# Patient Record
Sex: Male | Born: 2000 | Race: White | Hispanic: No | Marital: Single | Smoking: Never smoker
Health system: Southern US, Community
[De-identification: ages and names within clinical notes are randomized; demographics above are authoritative.]

## PROBLEM LIST (undated history)

## (undated) HISTORY — PX: ELBOW SURGERY: SHX618

---
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
## 2022-01-05 DIAGNOSIS — F4322 Adjustment disorder with anxiety: Secondary | ICD-10-CM | POA: Diagnosis not present

## 2022-02-05 DIAGNOSIS — F902 Attention-deficit hyperactivity disorder, combined type: Secondary | ICD-10-CM | POA: Diagnosis not present

## 2022-02-28 DIAGNOSIS — K648 Other hemorrhoids: Secondary | ICD-10-CM | POA: Diagnosis not present

## 2022-02-28 DIAGNOSIS — K120 Recurrent oral aphthae: Secondary | ICD-10-CM | POA: Diagnosis not present

## 2022-02-28 DIAGNOSIS — Z113 Encounter for screening for infections with a predominantly sexual mode of transmission: Secondary | ICD-10-CM | POA: Diagnosis not present

## 2022-03-18 DIAGNOSIS — F909 Attention-deficit hyperactivity disorder, unspecified type: Secondary | ICD-10-CM | POA: Diagnosis not present

## 2022-03-18 DIAGNOSIS — Z79899 Other long term (current) drug therapy: Secondary | ICD-10-CM | POA: Diagnosis not present

## 2022-03-18 DIAGNOSIS — Z0000 Encounter for general adult medical examination without abnormal findings: Secondary | ICD-10-CM | POA: Diagnosis not present

## 2022-03-18 DIAGNOSIS — Z113 Encounter for screening for infections with a predominantly sexual mode of transmission: Secondary | ICD-10-CM | POA: Diagnosis not present

## 2022-04-15 DIAGNOSIS — Z113 Encounter for screening for infections with a predominantly sexual mode of transmission: Secondary | ICD-10-CM | POA: Diagnosis not present

## 2022-04-15 DIAGNOSIS — Z79899 Other long term (current) drug therapy: Secondary | ICD-10-CM | POA: Diagnosis not present

## 2022-04-15 DIAGNOSIS — F909 Attention-deficit hyperactivity disorder, unspecified type: Secondary | ICD-10-CM | POA: Diagnosis not present

## 2022-04-15 DIAGNOSIS — Z68.41 Body mass index (BMI) pediatric, 5th percentile to less than 85th percentile for age: Secondary | ICD-10-CM | POA: Diagnosis not present

## 2022-04-20 DIAGNOSIS — F902 Attention-deficit hyperactivity disorder, combined type: Secondary | ICD-10-CM | POA: Diagnosis not present

## 2022-04-23 DIAGNOSIS — Z13228 Encounter for screening for other metabolic disorders: Secondary | ICD-10-CM | POA: Diagnosis not present

## 2022-04-23 DIAGNOSIS — Z113 Encounter for screening for infections with a predominantly sexual mode of transmission: Secondary | ICD-10-CM | POA: Diagnosis not present

## 2022-04-27 DIAGNOSIS — F902 Attention-deficit hyperactivity disorder, combined type: Secondary | ICD-10-CM | POA: Diagnosis not present

## 2022-05-04 DIAGNOSIS — F902 Attention-deficit hyperactivity disorder, combined type: Secondary | ICD-10-CM | POA: Diagnosis not present

## 2022-05-18 DIAGNOSIS — F902 Attention-deficit hyperactivity disorder, combined type: Secondary | ICD-10-CM | POA: Diagnosis not present

## 2022-05-25 DIAGNOSIS — F902 Attention-deficit hyperactivity disorder, combined type: Secondary | ICD-10-CM | POA: Diagnosis not present

## 2022-05-27 DIAGNOSIS — Z113 Encounter for screening for infections with a predominantly sexual mode of transmission: Secondary | ICD-10-CM | POA: Diagnosis not present

## 2022-05-27 DIAGNOSIS — Z68.41 Body mass index (BMI) pediatric, 5th percentile to less than 85th percentile for age: Secondary | ICD-10-CM | POA: Diagnosis not present

## 2022-05-27 DIAGNOSIS — Z79899 Other long term (current) drug therapy: Secondary | ICD-10-CM | POA: Diagnosis not present

## 2022-05-27 DIAGNOSIS — F909 Attention-deficit hyperactivity disorder, unspecified type: Secondary | ICD-10-CM | POA: Diagnosis not present

## 2022-06-01 DIAGNOSIS — F902 Attention-deficit hyperactivity disorder, combined type: Secondary | ICD-10-CM | POA: Diagnosis not present

## 2022-06-09 DIAGNOSIS — F902 Attention-deficit hyperactivity disorder, combined type: Secondary | ICD-10-CM | POA: Diagnosis not present

## 2022-06-15 DIAGNOSIS — F902 Attention-deficit hyperactivity disorder, combined type: Secondary | ICD-10-CM | POA: Diagnosis not present

## 2022-06-22 DIAGNOSIS — F902 Attention-deficit hyperactivity disorder, combined type: Secondary | ICD-10-CM | POA: Diagnosis not present

## 2022-06-23 DIAGNOSIS — F909 Attention-deficit hyperactivity disorder, unspecified type: Secondary | ICD-10-CM | POA: Diagnosis not present

## 2022-06-23 DIAGNOSIS — Z113 Encounter for screening for infections with a predominantly sexual mode of transmission: Secondary | ICD-10-CM | POA: Diagnosis not present

## 2022-06-23 DIAGNOSIS — Z68.41 Body mass index (BMI) pediatric, 5th percentile to less than 85th percentile for age: Secondary | ICD-10-CM | POA: Diagnosis not present

## 2022-06-23 DIAGNOSIS — Z79899 Other long term (current) drug therapy: Secondary | ICD-10-CM | POA: Diagnosis not present

## 2022-06-29 DIAGNOSIS — F902 Attention-deficit hyperactivity disorder, combined type: Secondary | ICD-10-CM | POA: Diagnosis not present

## 2022-07-01 DIAGNOSIS — B360 Pityriasis versicolor: Secondary | ICD-10-CM | POA: Diagnosis not present

## 2022-07-01 DIAGNOSIS — L308 Other specified dermatitis: Secondary | ICD-10-CM | POA: Diagnosis not present

## 2022-07-01 DIAGNOSIS — Z1283 Encounter for screening for malignant neoplasm of skin: Secondary | ICD-10-CM | POA: Diagnosis not present

## 2022-07-01 DIAGNOSIS — D225 Melanocytic nevi of trunk: Secondary | ICD-10-CM | POA: Diagnosis not present

## 2022-07-06 DIAGNOSIS — F902 Attention-deficit hyperactivity disorder, combined type: Secondary | ICD-10-CM | POA: Diagnosis not present

## 2022-08-14 DIAGNOSIS — F902 Attention-deficit hyperactivity disorder, combined type: Secondary | ICD-10-CM | POA: Diagnosis not present

## 2022-08-20 DIAGNOSIS — F902 Attention-deficit hyperactivity disorder, combined type: Secondary | ICD-10-CM | POA: Diagnosis not present

## 2022-08-24 DIAGNOSIS — F902 Attention-deficit hyperactivity disorder, combined type: Secondary | ICD-10-CM | POA: Diagnosis not present

## 2022-08-25 DIAGNOSIS — Z79899 Other long term (current) drug therapy: Secondary | ICD-10-CM | POA: Diagnosis not present

## 2022-08-25 DIAGNOSIS — Z113 Encounter for screening for infections with a predominantly sexual mode of transmission: Secondary | ICD-10-CM | POA: Diagnosis not present

## 2022-08-25 DIAGNOSIS — F909 Attention-deficit hyperactivity disorder, unspecified type: Secondary | ICD-10-CM | POA: Diagnosis not present

## 2022-08-31 DIAGNOSIS — F902 Attention-deficit hyperactivity disorder, combined type: Secondary | ICD-10-CM | POA: Diagnosis not present

## 2022-09-11 DIAGNOSIS — F902 Attention-deficit hyperactivity disorder, combined type: Secondary | ICD-10-CM | POA: Diagnosis not present

## 2022-09-15 DIAGNOSIS — F902 Attention-deficit hyperactivity disorder, combined type: Secondary | ICD-10-CM | POA: Diagnosis not present

## 2022-09-24 DIAGNOSIS — F902 Attention-deficit hyperactivity disorder, combined type: Secondary | ICD-10-CM | POA: Diagnosis not present

## 2022-09-29 DIAGNOSIS — F902 Attention-deficit hyperactivity disorder, combined type: Secondary | ICD-10-CM | POA: Diagnosis not present

## 2022-10-06 DIAGNOSIS — F902 Attention-deficit hyperactivity disorder, combined type: Secondary | ICD-10-CM | POA: Diagnosis not present

## 2022-10-19 DIAGNOSIS — Z113 Encounter for screening for infections with a predominantly sexual mode of transmission: Secondary | ICD-10-CM | POA: Diagnosis not present

## 2022-10-21 DIAGNOSIS — Z113 Encounter for screening for infections with a predominantly sexual mode of transmission: Secondary | ICD-10-CM | POA: Diagnosis not present

## 2022-10-22 DIAGNOSIS — F902 Attention-deficit hyperactivity disorder, combined type: Secondary | ICD-10-CM | POA: Diagnosis not present

## 2022-10-27 DIAGNOSIS — F902 Attention-deficit hyperactivity disorder, combined type: Secondary | ICD-10-CM | POA: Diagnosis not present

## 2022-10-28 DIAGNOSIS — Z79899 Other long term (current) drug therapy: Secondary | ICD-10-CM | POA: Diagnosis not present

## 2022-10-28 DIAGNOSIS — Z1329 Encounter for screening for other suspected endocrine disorder: Secondary | ICD-10-CM | POA: Diagnosis not present

## 2022-10-28 DIAGNOSIS — F909 Attention-deficit hyperactivity disorder, unspecified type: Secondary | ICD-10-CM | POA: Diagnosis not present

## 2022-10-28 DIAGNOSIS — Z113 Encounter for screening for infections with a predominantly sexual mode of transmission: Secondary | ICD-10-CM | POA: Diagnosis not present

## 2022-10-28 DIAGNOSIS — Z136 Encounter for screening for cardiovascular disorders: Secondary | ICD-10-CM | POA: Diagnosis not present

## 2022-11-05 DIAGNOSIS — F902 Attention-deficit hyperactivity disorder, combined type: Secondary | ICD-10-CM | POA: Diagnosis not present

## 2022-12-23 DIAGNOSIS — F4323 Adjustment disorder with mixed anxiety and depressed mood: Secondary | ICD-10-CM | POA: Diagnosis not present

## 2022-12-30 DIAGNOSIS — F4323 Adjustment disorder with mixed anxiety and depressed mood: Secondary | ICD-10-CM | POA: Diagnosis not present

## 2023-01-13 DIAGNOSIS — F4323 Adjustment disorder with mixed anxiety and depressed mood: Secondary | ICD-10-CM | POA: Diagnosis not present

## 2023-02-01 DIAGNOSIS — F4323 Adjustment disorder with mixed anxiety and depressed mood: Secondary | ICD-10-CM | POA: Diagnosis not present

## 2023-03-04 DIAGNOSIS — F900 Attention-deficit hyperactivity disorder, predominantly inattentive type: Secondary | ICD-10-CM | POA: Diagnosis not present

## 2023-03-04 DIAGNOSIS — F419 Anxiety disorder, unspecified: Secondary | ICD-10-CM | POA: Diagnosis not present

## 2023-03-04 DIAGNOSIS — R1032 Left lower quadrant pain: Secondary | ICD-10-CM | POA: Diagnosis not present

## 2023-03-05 DIAGNOSIS — M25522 Pain in left elbow: Secondary | ICD-10-CM | POA: Diagnosis not present

## 2023-04-08 DIAGNOSIS — R5383 Other fatigue: Secondary | ICD-10-CM | POA: Diagnosis not present

## 2023-04-08 DIAGNOSIS — Z0000 Encounter for general adult medical examination without abnormal findings: Secondary | ICD-10-CM | POA: Diagnosis not present

## 2023-04-08 DIAGNOSIS — F900 Attention-deficit hyperactivity disorder, predominantly inattentive type: Secondary | ICD-10-CM | POA: Diagnosis not present

## 2023-04-08 DIAGNOSIS — F419 Anxiety disorder, unspecified: Secondary | ICD-10-CM | POA: Diagnosis not present

## 2023-05-10 DIAGNOSIS — F900 Attention-deficit hyperactivity disorder, predominantly inattentive type: Secondary | ICD-10-CM | POA: Diagnosis not present

## 2023-05-10 DIAGNOSIS — F419 Anxiety disorder, unspecified: Secondary | ICD-10-CM | POA: Diagnosis not present

## 2023-05-10 DIAGNOSIS — R1032 Left lower quadrant pain: Secondary | ICD-10-CM | POA: Diagnosis not present

## 2023-05-14 DIAGNOSIS — R1032 Left lower quadrant pain: Secondary | ICD-10-CM | POA: Diagnosis not present

## 2023-06-10 DIAGNOSIS — F4323 Adjustment disorder with mixed anxiety and depressed mood: Secondary | ICD-10-CM | POA: Diagnosis not present

## 2023-06-14 DIAGNOSIS — F900 Attention-deficit hyperactivity disorder, predominantly inattentive type: Secondary | ICD-10-CM | POA: Diagnosis not present

## 2023-06-17 DIAGNOSIS — F4323 Adjustment disorder with mixed anxiety and depressed mood: Secondary | ICD-10-CM | POA: Diagnosis not present

## 2023-06-24 DIAGNOSIS — F4323 Adjustment disorder with mixed anxiety and depressed mood: Secondary | ICD-10-CM | POA: Diagnosis not present

## 2023-07-01 DIAGNOSIS — F4323 Adjustment disorder with mixed anxiety and depressed mood: Secondary | ICD-10-CM | POA: Diagnosis not present

## 2023-07-06 DIAGNOSIS — F4323 Adjustment disorder with mixed anxiety and depressed mood: Secondary | ICD-10-CM | POA: Diagnosis not present

## 2023-07-13 DIAGNOSIS — F900 Attention-deficit hyperactivity disorder, predominantly inattentive type: Secondary | ICD-10-CM | POA: Diagnosis not present

## 2023-07-15 DIAGNOSIS — F4323 Adjustment disorder with mixed anxiety and depressed mood: Secondary | ICD-10-CM | POA: Diagnosis not present

## 2023-09-24 DIAGNOSIS — H43812 Vitreous degeneration, left eye: Secondary | ICD-10-CM | POA: Diagnosis not present

## 2023-10-12 DIAGNOSIS — F900 Attention-deficit hyperactivity disorder, predominantly inattentive type: Secondary | ICD-10-CM | POA: Diagnosis not present

## 2023-10-19 DIAGNOSIS — R519 Headache, unspecified: Secondary | ICD-10-CM | POA: Diagnosis not present

## 2023-10-19 DIAGNOSIS — Z113 Encounter for screening for infections with a predominantly sexual mode of transmission: Secondary | ICD-10-CM | POA: Diagnosis not present

## 2023-12-07 DIAGNOSIS — D225 Melanocytic nevi of trunk: Secondary | ICD-10-CM | POA: Diagnosis not present

## 2023-12-07 DIAGNOSIS — Z1283 Encounter for screening for malignant neoplasm of skin: Secondary | ICD-10-CM | POA: Diagnosis not present

## 2024-01-07 DIAGNOSIS — F900 Attention-deficit hyperactivity disorder, predominantly inattentive type: Secondary | ICD-10-CM | POA: Diagnosis not present

## 2024-01-11 DIAGNOSIS — F4323 Adjustment disorder with mixed anxiety and depressed mood: Secondary | ICD-10-CM | POA: Diagnosis not present

## 2024-01-25 DIAGNOSIS — F4323 Adjustment disorder with mixed anxiety and depressed mood: Secondary | ICD-10-CM | POA: Diagnosis not present

## 2024-02-01 DIAGNOSIS — F4323 Adjustment disorder with mixed anxiety and depressed mood: Secondary | ICD-10-CM | POA: Diagnosis not present

## 2024-02-08 DIAGNOSIS — F4323 Adjustment disorder with mixed anxiety and depressed mood: Secondary | ICD-10-CM | POA: Diagnosis not present

## 2024-03-02 DIAGNOSIS — F4323 Adjustment disorder with mixed anxiety and depressed mood: Secondary | ICD-10-CM | POA: Diagnosis not present

## 2024-04-13 ENCOUNTER — Encounter: Payer: Self-pay | Admitting: Psychiatry

## 2024-04-13 ENCOUNTER — Ambulatory Visit (INDEPENDENT_AMBULATORY_CARE_PROVIDER_SITE_OTHER): Admitting: Psychiatry

## 2024-04-13 VITALS — BP 124/81 | HR 88 | Ht 71.0 in | Wt 127.0 lb

## 2024-04-13 DIAGNOSIS — F902 Attention-deficit hyperactivity disorder, combined type: Secondary | ICD-10-CM | POA: Diagnosis not present

## 2024-04-13 DIAGNOSIS — F411 Generalized anxiety disorder: Secondary | ICD-10-CM | POA: Diagnosis not present

## 2024-04-13 MED ORDER — FLUOXETINE HCL 20 MG PO CAPS: 20.0000 mg | ORAL_CAPSULE | Freq: Every day | ORAL | 1 refills | Status: DC
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
## 2024-04-14 ENCOUNTER — Telehealth: Payer: Self-pay | Admitting: Psychiatry

## 2024-04-14 MED ORDER — METHYLPHENIDATE HCL ER (OSM) 36 MG PO TBCR: 36.0000 mg | EXTENDED_RELEASE_TABLET | Freq: Every day | ORAL | 0 refills | Status: DC

## 2024-04-14 NOTE — Telephone Encounter (Signed)
 Called patient to clarify about Concerta . Note doesn't mention Concerta , says to continue Adderall. He said Dr. Conny mentioned switching to Concerta  since he had been on Adderall for years. He didn't want to switch, but after thinking about it and doing some research he is asking to switch to Concerta , still does not want to try Prozac .

## 2024-04-14 NOTE — Telephone Encounter (Signed)
 You saw patient for the first time yesterday. He was prescribed Prozac  for anxiety but is electing not to take it currently. He also said you discussed the possibility of switching him to Concerta . Note mentioned to continue Adderall, but he has done some research and thought about it and would like to switch to Concerta .

## 2024-04-14 NOTE — Telephone Encounter (Signed)
 Pt called at 9:05a stating that he had an appt yesterday with Dr Conny.  He said he thought about it some more and he isn't comfortable with taking the Prozac  for the general anxiety right now, but he would like to take the Concerta  for the ADHD.  Next appt 10/9

## 2024-04-18 DIAGNOSIS — Z0000 Encounter for general adult medical examination without abnormal findings: Secondary | ICD-10-CM | POA: Diagnosis not present

## 2024-04-18 DIAGNOSIS — F900 Attention-deficit hyperactivity disorder, predominantly inattentive type: Secondary | ICD-10-CM | POA: Diagnosis not present

## 2024-04-20 ENCOUNTER — Telehealth: Payer: Self-pay | Admitting: Psychiatry

## 2024-04-20 NOTE — Telephone Encounter (Signed)
 He is scheduled for 9/15

## 2024-04-20 NOTE — Telephone Encounter (Signed)
 Pt would like to try Concerta . He is wanting a medication for anxiety and said the only medication he will take is Pristiq. He has only been seen one time.

## 2024-04-20 NOTE — Telephone Encounter (Signed)
 Pt called today at 10:44a.  He said he was waiting to hear back from someone about taking something other than the Prozac .  He said he has been reading and would be interested in taking Pristiq if possible.  Pls call him back to discuss.  Next appt 10/7

## 2024-04-20 NOTE — Telephone Encounter (Signed)
 Please check to see if there is an earlier appt with Dr. Conny and call pt to schedule if available.

## 2024-04-24 ENCOUNTER — Telehealth: Payer: Self-pay | Admitting: Psychiatry

## 2024-04-24 ENCOUNTER — Ambulatory Visit: Admitting: Psychiatry

## 2024-04-24 NOTE — Telephone Encounter (Signed)
 Pt called and said that he doesn't feel comfortable taking a SSI. So he has noot taken the prozac . He tried the concerta  but that didn't work well either. He has done research and he would like to try prestiq or buspirone. For his ADHD he would like to try focalin. He also interested in the genesight test. Please call him at [phone]

## 2024-04-25 NOTE — Telephone Encounter (Signed)
 See message from patient.  He is asking for [employer] Would you like for him to have it done so that you can have results back for next visit?

## 2024-04-26 NOTE — Telephone Encounter (Signed)
 Jeffrey Gonzales - he can come in to get it done whenever someone up front can complete the test for him

## 2024-04-26 NOTE — Telephone Encounter (Signed)
 Pt is requesting Genesight. Called him and gave him dates/times that he could get swab done, no appt needed. Hopefully we can get results back for his appt.

## 2024-04-27 ENCOUNTER — Ambulatory Visit: Admitting: Psychiatry

## 2024-05-04 ENCOUNTER — Other Ambulatory Visit: Payer: Self-pay | Admitting: Psychiatry

## 2024-05-04 ENCOUNTER — Ambulatory Visit: Admitting: Psychiatry

## 2024-05-04 ENCOUNTER — Encounter: Payer: Self-pay | Admitting: Psychiatry

## 2024-05-04 ENCOUNTER — Ambulatory Visit (INDEPENDENT_AMBULATORY_CARE_PROVIDER_SITE_OTHER): Admitting: Psychiatry

## 2024-05-04 DIAGNOSIS — F411 Generalized anxiety disorder: Secondary | ICD-10-CM | POA: Diagnosis not present

## 2024-05-04 DIAGNOSIS — F902 Attention-deficit hyperactivity disorder, combined type: Secondary | ICD-10-CM

## 2024-05-04 MED ORDER — DESVENLAFAXINE SUCCINATE ER 50 MG PO TB24: 50.0000 mg | ORAL_TABLET | Freq: Every day | ORAL | 0 refills | Status: DC

## 2024-05-04 MED ORDER — DESVENLAFAXINE SUCCINATE ER 25 MG PO TB24: ORAL_TABLET | ORAL | 1 refills | Status: DC

## 2024-05-04 MED ORDER — DEXMETHYLPHENIDATE HCL ER 10 MG PO CP24: 10.0000 mg | ORAL_CAPSULE | Freq: Every day | ORAL | 0 refills | Status: DC

## 2024-05-04 NOTE — Progress Notes (Signed)
 [facility] [address], [place]   Follow-up visit  Date of Service: 05/04/2024  CC/Purpose: Routine medication management follow up.    Jeffrey Gonzales is a 23 y.o. male with a past psychiatric history of anxiety, ADHD who presents today for a psychiatric follow up appointment..    The patient was last seen on 04/13/24, at which time the following plan was established:    Medication management:             - Start Prozac  20mg  daily             - Continue Adderall XR 20mg  daily _______________________________________________________________________________________ Acute events/encounters since last visit: denies    Jeffrey Gonzales presents to clinic in person. He states that he tried Concerta  once after his last visit but didn't like it. He has been on Adderall since then. He didn't start Prozac  due to worries this would be similar to Zoloft as it is an SSRI. He reports feeling anxious often, about his health, medicines, how to get better, etc. This occupies his mind to an obsessive degree. Reviewed medicine options. No SI/HI/AVH.    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   Psychiatric Specialty Exam: There were no vitals taken for this visit.There is no height or weight on file to calculate BMI.  General Appearance: Guarded, Neat, and Well Groomed  Eye Contact:  Good  Speech:  Clear and Coherent  Mood:   Anxious  Affect:  Congruent  Thought Process:  Goal Directed  Orientation:  Full (Time, Place, and Person)  Thought Content:  Logical  Suicidal Thoughts:  No  Homicidal Thoughts:  No  Memory:  Immediate;   Good  Judgement:  Good  Insight:  Good  Psychomotor Activity:  Normal  Concentration:  Concentration: Good  Recall:  Good  Fund of Knowledge:  Good  Language:  Good  Assets:  Communication Skills Desire for Improvement Financial Resources/Insurance Housing Leisure Time Physical Health Resilience Social Support Talents/Skills Transportation Vocational/Educational  Cognition:  WNL      Assessment   Psychiatric Diagnoses:   ICD-10-CM   1. Generalized anxiety disorder  F41.1     2. Attention deficit hyperactivity disorder (ADHD), combined type  F90.2       Patient complexity: Moderate   Patient Education and Counseling:  Supportive therapy provided for identified psychosocial stressors.  Medication education provided and decisions regarding medication regimen discussed with patient/guardian.   On assessment today, Jeffrey Gonzales has struggled with anxiety since his last visit. His anxiety appears to center around his health, medicines, and potential risks or negatives. This is severe enough to impact treatment as he is hesitant to try several medicines. We will plan on the medicines below, and he will look into starting with a new therapist. No SI/HI/AHV.    Plan  Medication management:  - Stop Adderall  - Start Focalin  XR 10mg  daily  - Start Pristiq  25mg  daily for one week then increase to 50mg  daily  Labs/Studies:  -  reviewed  Additional recommendations:  - Recommend starting therapy, Crisis plan reviewed and patient verbally contracts for safety. Go to ED with emergent symptoms or safety concerns, and Risks, benefits, side effects of medications, including any / all black box warnings, discussed with patient, who verbalizes their understanding   Follow Up: Return  in 1 month - Call in the interim for any side-effects, decompensation, questions, or problems between now and the next visit.   I have spent 25 minutes reviewing the patients chart, meeting with the patient and family, and reviewing medicines and side effects.   [name], MD [facility]

## 2024-05-09 DIAGNOSIS — F411 Generalized anxiety disorder: Secondary | ICD-10-CM | POA: Diagnosis not present

## 2024-05-11 ENCOUNTER — Telehealth: Payer: Self-pay | Admitting: Psychiatry

## 2024-05-11 NOTE — Telephone Encounter (Signed)
 Please put patient on CXL.

## 2024-05-11 NOTE — Telephone Encounter (Signed)
 We would need an appointment to change his medicines. With his anxiety about medicines I don't feel it's appropriate to make changes between visits

## 2024-05-11 NOTE — Telephone Encounter (Signed)
 Pt states he started 2 new meds and doesn't like the side effects. Wants cb to discuss.

## 2024-05-11 NOTE — Telephone Encounter (Signed)
 Pt seen 9/25: He had said previously that Pristiq  was the only AD he would consider and he wanted a different stimulant. He is reporting tingling in his head/brain zaps, he is irritable, and he has trouble concentrating/listening. Genesight was done, but lab shows that sample was not received and we are trying to track it down. He has FU 10/23.   On assessment today, Jeffrey Gonzales has struggled with anxiety since his last visit. His anxiety appears to center around his health, medicines, and potential risks or negatives. This is severe enough to impact treatment as he is hesitant to try several medicines. We will plan on the medicines below, and he will look into starting with a new therapist. No SI/HI/AHV.      Plan   Medication management:             - Stop Adderall             - Start Focalin  XR 10mg  daily             - Start Pristiq  25mg  daily for one week then increase to 50mg  daily

## 2024-05-12 DIAGNOSIS — F902 Attention-deficit hyperactivity disorder, combined type: Secondary | ICD-10-CM | POA: Diagnosis not present

## 2024-05-16 ENCOUNTER — Ambulatory Visit: Admitting: Psychiatry

## 2024-05-16 DIAGNOSIS — F411 Generalized anxiety disorder: Secondary | ICD-10-CM | POA: Diagnosis not present

## 2024-05-19 ENCOUNTER — Ambulatory Visit: Admitting: Psychiatry

## 2024-05-19 ENCOUNTER — Encounter: Payer: Self-pay | Admitting: Psychiatry

## 2024-05-19 DIAGNOSIS — F411 Generalized anxiety disorder: Secondary | ICD-10-CM | POA: Diagnosis not present

## 2024-05-19 DIAGNOSIS — F902 Attention-deficit hyperactivity disorder, combined type: Secondary | ICD-10-CM | POA: Diagnosis not present

## 2024-05-19 MED ORDER — DESVENLAFAXINE SUCCINATE ER 50 MG PO TB24: 50.0000 mg | ORAL_TABLET | Freq: Every day | ORAL | 1 refills | Status: DC

## 2024-05-19 MED ORDER — LISDEXAMFETAMINE DIMESYLATE 20 MG PO CAPS: 20.0000 mg | ORAL_CAPSULE | Freq: Every day | ORAL | 0 refills | Status: DC

## 2024-05-19 NOTE — Progress Notes (Signed)
 [facility] [address], [place]   Follow-up visit  Date of Service: 05/19/2024  CC/Purpose: Routine medication management follow up.    Jeffrey Gonzales is a 23 y.o. male with a past psychiatric history of anxiety, ADHD who presents today for a psychiatric follow up appointment..    The patient was last seen on 05/04/24, at which time the following plan was established:  Medication management:             - Stop Adderall             - Start Focalin  XR 10mg  daily             - Start Pristiq  25mg  daily for one week then increase to 50mg  daily _______________________________________________________________________________________ Acute events/encounters since last visit: denies    Jeffrey Gonzales presents to clinic in person. He states that he has been taking his medicines daily. At first he felt that he had some bad side effects. He had a bad headache and sharp pains initially. Then he started to notice this improve the longer he took it. He has been on Pristiq  50mg  for about two weeks. He isn't sure if his anxiety is better at this point. He feels the Focalin  doesn't help much, and still sees a pretty bad crash as it wears off. He would like to eventually stop his medicines. No SI/HI/AVH.    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   Psychiatric Specialty Exam: There were no vitals taken for this visit.There is no  height or weight on file to calculate BMI.  General Appearance: Guarded, Neat, and Well Groomed  Eye Contact:  Good  Speech:  Clear and Coherent  Mood:  Anxious  Affect:  Congruent  Thought Process:  Goal Directed  Orientation:  Full (Time, Place, and Person)  Thought Content:  Logical  Suicidal Thoughts:  No  Homicidal Thoughts:  No  Memory:  Immediate;   Good  Judgement:  Good  Insight:  Good  Psychomotor Activity:  Normal  Concentration:  Concentration: Good  Recall:  Good  Fund of Knowledge:  Good  Language:  Good  Assets:  Communication Skills Desire for Improvement Financial Resources/Insurance Housing Leisure Time Physical Health Resilience Social Support Talents/Skills Transportation Vocational/Educational  Cognition:  WNL      Assessment   Psychiatric Diagnoses:   ICD-10-CM   1. Generalized anxiety disorder  F41.1     2. Attention deficit hyperactivity disorder (ADHD), combined type  F90.2        Patient complexity: Moderate   Patient Education and Counseling:  Supportive therapy provided for identified psychosocial stressors.  Medication education provided and decisions regarding medication regimen discussed with patient/guardian.   On assessment today, Jeffrey Gonzales has been taking his medicine as prescribed. He had some side effects when he started these medicines, including brain zaps, but these have improved. We will monitor his anxiety as he remains on Pristiq . We will try another stimulant that he has had a  positive response to before, which shouldn't have a major crash. No SI/HI/AHV.    Plan  Medication management:  - Pristiq  50mg  daily  - Stop Focalin   - Start Vyvanse 20mg  daily  Labs/Studies:  - reviewed  Additional recommendations:  - Continue with current therapist, Crisis plan reviewed and patient verbally contracts for safety. Go to ED with emergent symptoms or safety concerns, and Risks, benefits, side effects of medications, including  any / all black box warnings, discussed with patient, who verbalizes their understanding   Follow Up: Return in 1 month - Call in the interim for any side-effects, decompensation, questions, or problems between now and the next visit.   I have spent 25 minutes reviewing the patients chart, meeting with the patient and family, and reviewing medicines and side effects.   [name], MD [facility]

## 2024-05-24 ENCOUNTER — Encounter: Payer: Self-pay | Admitting: Psychiatry

## 2024-05-25 DIAGNOSIS — F411 Generalized anxiety disorder: Secondary | ICD-10-CM | POA: Diagnosis not present

## 2024-06-01 ENCOUNTER — Ambulatory Visit: Admitting: Psychiatry

## 2024-06-01 DIAGNOSIS — F411 Generalized anxiety disorder: Secondary | ICD-10-CM | POA: Diagnosis not present

## 2024-06-08 DIAGNOSIS — F411 Generalized anxiety disorder: Secondary | ICD-10-CM | POA: Diagnosis not present

## 2024-06-15 DIAGNOSIS — F411 Generalized anxiety disorder: Secondary | ICD-10-CM | POA: Diagnosis not present

## 2024-06-19 ENCOUNTER — Encounter: Payer: Self-pay | Admitting: Psychiatry

## 2024-06-19 ENCOUNTER — Ambulatory Visit (INDEPENDENT_AMBULATORY_CARE_PROVIDER_SITE_OTHER): Admitting: Psychiatry

## 2024-06-19 DIAGNOSIS — F411 Generalized anxiety disorder: Secondary | ICD-10-CM | POA: Diagnosis not present

## 2024-06-19 DIAGNOSIS — F902 Attention-deficit hyperactivity disorder, combined type: Secondary | ICD-10-CM

## 2024-06-19 MED ORDER — DESVENLAFAXINE SUCCINATE ER 25 MG PO TB24: 25.0000 mg | ORAL_TABLET | Freq: Every day | ORAL | 1 refills | Status: DC

## 2024-06-19 MED ORDER — LISDEXAMFETAMINE DIMESYLATE 20 MG PO CAPS: 20.0000 mg | ORAL_CAPSULE | Freq: Every day | ORAL | 0 refills | Status: DC

## 2024-06-19 NOTE — Progress Notes (Signed)
 [facility] [address], [place]   Follow-up visit  Date of Service: 06/19/2024  CC/Purpose: Routine medication management follow up.    Jeffrey Gonzales is a 23 y.o. male with a past psychiatric history of anxiety, ADHD who presents today for a psychiatric follow up appointment..    The patient was last seen on 05/19/24, at which time the following plan was established: Medication management:             - Pristiq  50mg  daily             - Stop Focalin              - Start Vyvanse 20mg  daily _______________________________________________________________________________________ Acute events/encounters since last visit: denies    Jeffrey Gonzales presents to clinic in person. He reports that things have been going okay. He is still in school and working. He feels that things are going okay with school and work. He is taking his medicines daily. He has noticed that his anxiety is improved, but now feels that he just lacks emotions. He doesn't feel that he is happy or excited about things. Discussed some medicine adjustments that we can try. He thinks Vyvanse is okay, but doesn't have a strong opinion on it. No SI/HI/AVH.    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   Psychiatric Specialty Exam: There were no vitals taken for this visit.There is no height or weight on file to calculate BMI.  General Appearance: Guarded,  Neat, and Well Groomed  Eye Contact:  Good  Speech:  Clear and Coherent  Mood:  Anxious  Affect:  Congruent  Thought Process:  Goal Directed  Orientation:  Full (Time, Place, and Person)  Thought Content:  Logical  Suicidal Thoughts:  No  Homicidal Thoughts:  No  Memory:  Immediate;   Good  Judgement:  Good  Insight:  Good  Psychomotor Activity:  Normal  Concentration:  Concentration: Good  Recall:  Good  Fund of Knowledge:  Good  Language:  Good  Assets:  Communication Skills Desire for Improvement Financial Resources/Insurance Housing Leisure Time Physical Health Resilience Social Support Talents/Skills Transportation Vocational/Educational  Cognition:  WNL      Assessment   Psychiatric Diagnoses:   ICD-10-CM   1. Generalized anxiety disorder  F41.1     2. Attention deficit hyperactivity disorder (ADHD), combined type  F90.2        Patient complexity: Moderate   Patient Education and Counseling:  Supportive therapy provided for identified psychosocial stressors.  Medication education provided and decisions regarding medication regimen discussed with patient/guardian.   On assessment today, Jeffrey Gonzales has been taking his medicine as prescribed. He reports some emotional flattening related to Pristiq . We will plan on reducing this and monitoring his anxiety. No SI/HI/AHV.    Plan  Medication management:  - Decrease Pristiq  to 25mg  daily  - Vyvanse 20mg  daily  Labs/Studies:  - reviewed  Additional recommendations:  - Continue with current therapist, Crisis plan reviewed and  patient verbally contracts for safety. Go to ED with emergent symptoms or safety concerns, and Risks, benefits, side effects of medications, including any / all black box warnings, discussed with patient, who verbalizes their understanding   Follow Up: Return in 1 month - Call in the interim for any side-effects, decompensation, questions, or problems between now and the next visit.   I  have spent 25 minutes reviewing the patients chart, meeting with the patient and family, and reviewing medicines and side effects.   [name], MD [facility]

## 2024-07-10 ENCOUNTER — Telehealth: Payer: Self-pay | Admitting: Psychiatry

## 2024-07-10 NOTE — Telephone Encounter (Signed)
 Pt called again at 4:55p asking about the status of call this morning

## 2024-07-10 NOTE — Telephone Encounter (Signed)
 Pt called at 10:20a stating he talked to Dr Conny about possibly changing from Vyvanse  back to Adderall XR.  He would like to do that.  He said he threw away the Vyvanse .  [id number] - [place], [place] - [address] AT [facility] [address], [place] [address] Phone: [phone]  Fax: [phone]    Next appt 1/12

## 2024-07-10 NOTE — Telephone Encounter (Signed)
 LF Vyvanse  11/11

## 2024-07-11 NOTE — Telephone Encounter (Signed)
 Pt called to report he didn't like the Vyvanse  and threw it out. He last filled 11/11. He is reporting that you mentioned possibly retrying Adderall XR. He had previously reported he didn't like it and hasn't liked anything so far. He said he didn't want to take a stimulant, but he needed it for focus. Reports Vyvanse  made him feel jiggly inside and methylphenidate  made him withdraw socially. He has FU 1/12. He took last dose of Vyvanse  2 days ago.   Please advise.

## 2024-07-12 MED ORDER — AMPHETAMINE-DEXTROAMPHET ER 10 MG PO CP24: 10.0000 mg | ORAL_CAPSULE | Freq: Every day | ORAL | 0 refills | Status: DC

## 2024-07-12 NOTE — Addendum Note (Signed)
 Addended by: Vaanya Shambaugh on: 07/12/2024 08:57 AM   Modules accepted: Orders

## 2024-07-12 NOTE — Telephone Encounter (Signed)
 I sent Adderall XR 10mg  daily

## 2024-07-24 ENCOUNTER — Telehealth: Payer: Self-pay | Admitting: Psychiatry

## 2024-07-24 NOTE — Telephone Encounter (Signed)
 Pt called at 10:14a requesting to talk to Dr Conny about either changing the dose of Pristiq  or going on Prozac .  Pls call to discuss  Next appt 1/12

## 2024-07-25 MED ORDER — FLUOXETINE HCL 10 MG PO CAPS: 10.0000 mg | ORAL_CAPSULE | Freq: Every day | ORAL | 1 refills | Status: DC

## 2024-07-25 NOTE — Telephone Encounter (Signed)
 We can send Prozac  10mg  daily

## 2024-07-25 NOTE — Telephone Encounter (Signed)
 Rx for Prozac  10 mg sent to the requested pharmacy.

## 2024-07-25 NOTE — Telephone Encounter (Signed)
 Pt reporting he doesn't feel like Pristiq  is working. He wants to try Prozac  at lowest dose (he is requesting 10 mg).   [facility] [address]  Will send as appropriate.

## 2024-08-14 ENCOUNTER — Telehealth: Payer: Self-pay | Admitting: Psychiatry

## 2024-08-14 DIAGNOSIS — F902 Attention-deficit hyperactivity disorder, combined type: Secondary | ICD-10-CM

## 2024-08-14 NOTE — Telephone Encounter (Signed)
 Pended Adderall XR 15.

## 2024-08-14 NOTE — Telephone Encounter (Signed)
 Jeffrey Gonzales called asking for a refill on his adderall xr. He would like to increase dose from 10 to 15 mg

## 2024-08-15 MED ORDER — AMPHETAMINE-DEXTROAMPHET ER 15 MG PO CP24: 15.0000 mg | ORAL_CAPSULE | ORAL | 0 refills | Status: DC

## 2024-08-17 ENCOUNTER — Other Ambulatory Visit: Payer: Self-pay | Admitting: Psychiatry

## 2024-08-21 ENCOUNTER — Encounter: Payer: Self-pay | Admitting: Psychiatry

## 2024-08-21 ENCOUNTER — Ambulatory Visit: Admitting: Psychiatry

## 2024-08-21 DIAGNOSIS — F411 Generalized anxiety disorder: Secondary | ICD-10-CM

## 2024-08-21 DIAGNOSIS — F902 Attention-deficit hyperactivity disorder, combined type: Secondary | ICD-10-CM | POA: Diagnosis not present

## 2024-08-21 MED ORDER — AMPHETAMINE-DEXTROAMPHETAMINE 5 MG PO TABS: 5.0000 mg | ORAL_TABLET | Freq: Two times a day (BID) | ORAL | 0 refills | Status: DC

## 2024-08-21 MED ORDER — FLUOXETINE HCL 10 MG PO CAPS: 10.0000 mg | ORAL_CAPSULE | Freq: Every day | ORAL | 1 refills | Status: DC

## 2024-08-21 NOTE — Progress Notes (Signed)
 "  [facility] [address], [place]   Follow-up visit  Date of Service: 08/21/2024  CC/Purpose: Routine medication management follow up.    Jeffrey Gonzales is a 24 y.o. male with a past psychiatric history of anxiety, ADHD who presents today for a psychiatric follow up appointment..    The patient was last seen on 06/19/24, at which time the following plan was established: Medication management:             - Decrease Pristiq  to 25mg  daily             - Vyvanse  20mg  daily   _______________________________________________________________________________________ Acute events/encounters since last visit: denies    Jeffrey Gonzales presents to clinic in person. He reports that things have been going pretty well. He has been taking his medicine as prescribed. He feels the higher dose of Adderall hasn't been great - he notices some brain zap type feelings. He would like to adjust this some. He wants to try the immediate release to see if this is better. He feels that Prozac  has helped some. He doesn't want to adjust this for now - noted some side effects when it was started. No SI/HI/AVH.    Sleep: stable Appetite: Stable Depression: denies Bipolar symptoms:  denies Current suicidal/homicidal ideations:  denied Current auditory/visual hallucinations:  denied     Non-Suicidal Self-Injury: denies Suicide Attempt History: denies  Psychotherapy: yes Previous psychiatric medication trials:  Vyvanse , Adderall, Wellbutrin    [employer], server at [employer]     Current Living Situation (including members of house hold): lives with mom and step dad    No Known Allergies    Labs:  reviewed  Medical diagnoses: There are no active problems to display for this patient.   Psychiatric Specialty Exam: There were no vitals taken for this visit.There is no height or weight on file to calculate BMI.  General Appearance: Guarded, Neat, and Well Groomed  Eye Contact:   Good  Speech:  Clear and Coherent  Mood:  Anxious  Affect:  Congruent  Thought Process:  Goal Directed  Orientation:  Full (Time, Place, and Person)  Thought Content:  Logical  Suicidal Thoughts:  No  Homicidal Thoughts:  No  Memory:  Immediate;   Good  Judgement:  Good  Insight:  Good  Psychomotor Activity:  Normal  Concentration:  Concentration: Good  Recall:  Good  Fund of Knowledge:  Good  Language:  Good  Assets:  Communication Skills Desire for Improvement Financial Resources/Insurance Housing Leisure Time Physical Health Resilience Social Support Talents/Skills Transportation Vocational/Educational  Cognition:  WNL      Assessment   Psychiatric Diagnoses:   ICD-10-CM   1. Generalized anxiety disorder  F41.1     2. Attention deficit hyperactivity disorder (ADHD), combined type  F90.2       Patient complexity: Moderate   Patient Education and Counseling:  Supportive therapy provided for identified psychosocial stressors.  Medication education provided and decisions regarding medication regimen discussed with patient/guardian.   On assessment today, Jeffrey Gonzales has been doing okay with the switch to Prozac . He feels his anxiety is improved some. We will adjust his Adderall to immediate release to see if this helps his ADHD. No SI/HI/AHV.    Plan  Medication management:  - Continue Prozac  10mg  daily  - Change to Adderall IR 5mg  BID  Labs/Studies:  - reviewed  Additional recommendations:  - Continue with current therapist, Crisis plan reviewed and patient verbally contracts for safety.  Go to ED with emergent symptoms or safety concerns, and Risks, benefits, side effects of medications, including any / all black box warnings, discussed with patient, who verbalizes their understanding   Follow Up: Return in 3 months - Call in the interim for any side-effects, decompensation, questions, or problems between now and the next visit.   I have spent 25 minutes  reviewing the patients chart, meeting with the patient and family, and reviewing medicines and side effects.   [name], MD [facility]     "

## 2024-08-22 ENCOUNTER — Telehealth: Payer: Self-pay | Admitting: Psychiatry

## 2024-08-22 NOTE — Telephone Encounter (Signed)
 Patient called stating that his dosage for Adderall was changed. He went to class today and realized the dosage change isn't working. Having trouble concentrating and jittery in general. He asked if he could be put on low dosage Vyvanse . Pls rtc [phone]

## 2024-08-22 NOTE — Telephone Encounter (Signed)
 The patient requests frequent changes in stimulants. Call to ask if he wants Vyvanse  instead of Adderall or in addition to.    08/21/2024 08/21/2024 1  Dextroamp-Amphetamine  5 Mg Tab 60.00 30 Ja Han [id number] Nor (2805) 0/0  Comm Ins [place] 08/15/2024 08/15/2024 1  Dextroamp-Amphet Er 15 Mg Cap 30.00 30 Ja Han [id number] Nor (2805) 0/0  Comm Ins [place] 07/12/2024 07/12/2024 1  Dextroamp-Amphet Er 10 Mg Cap 30.00 30 Ja Han [id number] Nor (2805) 0/0  Private Pay [place] 06/19/2024 06/19/2024 1  Lisdexamfetamine  20 Mg Capsule 30.00

## 2024-08-23 NOTE — Telephone Encounter (Signed)
 See message from patient.  Having trouble concentrating and jittery in general on the last dosing of Adderall. He asked if he could be put on low dose Vyvanse .   Called him to ask if he wanted Vyvanse  in place of Adderall or in addition to. He said in place of Adderall. He was recently on 20 mg Vyvanse . Asked him if he wanted a lower dose than 20 mg and he said he would like to try that.

## 2024-08-24 ENCOUNTER — Telehealth: Payer: Self-pay | Admitting: Psychiatry

## 2024-08-24 MED ORDER — LISDEXAMFETAMINE DIMESYLATE 10 MG PO CAPS: 10.0000 mg | ORAL_CAPSULE | Freq: Every day | ORAL | 0 refills | Status: DC

## 2024-08-24 NOTE — Telephone Encounter (Signed)
 Sent Vyvanse  10mg  daily

## 2024-08-24 NOTE — Telephone Encounter (Signed)
 Renee called and said that the prozac  is to expensive as a 90 day. Please cancel and send 30 day with refills. Also he says that the vyvanse  in the past he took 30 mg. It helped with his focusing for school but when he working he is too focused and can't interact with people. He said that it takes his personality away. He didn't pick either medicine. He feels like hi at a wall. He needs to know what to do. Please call [place] at [phone]

## 2024-08-24 NOTE — Telephone Encounter (Signed)
 LVM to [facility]

## 2024-08-25 MED ORDER — FLUOXETINE HCL 10 MG PO CAPS: 10.0000 mg | ORAL_CAPSULE | Freq: Every day | ORAL | 1 refills | Status: AC

## 2024-08-25 NOTE — Telephone Encounter (Signed)
 Pt called about Vyvanse  dose of 10 mg. He had requested a low dose of Vyvanse . Told him he had recently been on 20 mg and asked if he wanted to go up or go lower and he asked to go lower. Rx for 10 mg was sent but he didn't fill. He calls now to say he doesn't think it would work, had been on 30 mg previously. I told him that stimulants by nature have the potential to cause anxiety or jittery feeling. He said he had investigated and that Focalin  has less chance of causing this. He asks if he can try Focalin  15 mg. He is aware you are out of the office today.

## 2024-08-25 NOTE — Telephone Encounter (Signed)
 Sent Rx for Prozac  for 30-day supply.

## 2024-08-28 MED ORDER — DEXMETHYLPHENIDATE HCL ER 10 MG PO CP24: 10.0000 mg | ORAL_CAPSULE | Freq: Every day | ORAL | 0 refills | Status: DC

## 2024-08-28 NOTE — Telephone Encounter (Signed)
 LVM to [facility]

## 2024-08-28 NOTE — Telephone Encounter (Signed)
 I'm sending in Focalin  10 - not 15

## 2024-08-28 NOTE — Addendum Note (Signed)
 Addended by: Alexsa Flaum on: 08/28/2024 09:05 AM   Modules accepted: Orders

## 2024-08-30 NOTE — Telephone Encounter (Signed)
 Rx filled 1/19.

## 2024-09-01 ENCOUNTER — Telehealth: Payer: Self-pay

## 2024-09-01 ENCOUNTER — Telehealth: Payer: Self-pay | Admitting: Psychiatry

## 2024-09-01 NOTE — Telephone Encounter (Signed)
 Patient called again regarding previous messages. States that he would like try Dexedrine . He has tried this before and worked well for him. PH: [phone]

## 2024-09-01 NOTE — Telephone Encounter (Signed)
 Transferred to [employer]

## 2024-09-01 NOTE — Telephone Encounter (Addendum)
 Pt has made numerous changes in stimulants. He recently asked for Focalin  15 mg and you sent Rx for 10 mg, filled on 1/19. He was last seen on 1/12 and has FU in April.   He called today to ask for generic Adderall ER 10 mg, said he feels this works best for him. This is his 4th change request this month.  Pt called back today and would like to try dexedrine , after asking for Adderall earlier today.   08/28/2024 08/28/2024 1  Dexmethylphenidate  Er 10 Mg Cp 30.00 30 Ja Han [id number] Nor (2805) 0/0  Comm Ins [place] 08/21/2024 08/21/2024 1  Dextroamp-Amphetamine  5 Mg Tab 60.00 30 Ja Han [id number] Nor (2805) 0/0  Comm Ins [place] 08/15/2024 08/15/2024 1  Dextroamp-Amphet Er 15 Mg Cap 30.00 30 Ja Han [id number] Nor (2805) 0/0  Comm Ins [place] 07/12/2024 07/12/2024 1  Dextroamp-Amphet Er 10 Mg Cap 30.00 30 Ja Han [id number] Nor (2805) 0/0  Private Pay [place] 06/19/2024 06/19/2024 1  Lisdexamfetamine  20 Mg Capsule 30.00 30 Ja Han

## 2024-09-01 NOTE — Telephone Encounter (Signed)
 Please call patient to schedule earlier appt and put on CXL. Provider requests appt to discuss medications before any other changes are made.

## 2024-09-01 NOTE — Telephone Encounter (Signed)
 Given how rapidly we are changing medicines - I would prefer an appointment

## 2024-09-04 ENCOUNTER — Telehealth: Payer: Self-pay | Admitting: Psychiatry

## 2024-09-04 MED ORDER — DEXTROAMPHETAMINE SULFATE ER 5 MG PO CP24: 5.0000 mg | ORAL_CAPSULE | Freq: Every day | ORAL | 0 refills | Status: AC

## 2024-09-04 NOTE — Telephone Encounter (Signed)
 Pt LVM @ 2:55p that Dr Conny sent  Dexedrine  5mg  and he said it's not worth it unless he gets 10mg .

## 2024-09-04 NOTE — Telephone Encounter (Signed)
 No more refills for another month, no further changes to stimulants for another month at least. I've sent dexedrine   but we will not make any further changes for at least a month

## 2024-09-04 NOTE — Telephone Encounter (Signed)
 Msg said you would send in Rx for dexedrine  provided he has FU in Feb. Appt has been made. I don't know the last stimulant he filled.  Pt wants Dexedrine  sent to    [id number] - [place], [place] - [address] [facility] [address]         08/28/2024 08/28/2024 1  Dexmethylphenidate  Er 10 Mg Cp 30.00 30 Ja Han [id number] Nor (2805) 0/0  Comm Ins [place] 08/21/2024 08/21/2024 1  Dextroamp-Amphetamine  5 Mg Tab 60.00 30 Ja Han [id number] Nor (2805) 0/0  Comm Ins [place] 08/15/2024 08/15/2024 1  Dextroamp-Amphet Er 15 Mg Cap 30.00 30 Ja Han [id number] Nor (2805) 0/0  Comm Ins [place] 07/12/2024 07/12/2024 1  Dextroamp-Amphet Er 10 Mg Cap 30.00 30 Ja Han [id number] Nor (2805) 0/0  Private Pay  06/19/2024 06/19/2024 1  Lisdexamfetamine  20 Mg Capsule 30.00 30

## 2024-09-04 NOTE — Telephone Encounter (Signed)
 Pt wants Dexedrine  sent to   [id number] [place], [place] - [address] [facility] [address], [place] [address] Phone: [phone]  Fax: [phone]   Dr Conny says he will send it in on the condition of pt making follow up appt in a month.    Next appt 2/27

## 2024-09-05 ENCOUNTER — Telehealth: Payer: Self-pay | Admitting: Psychiatry

## 2024-09-05 NOTE — Telephone Encounter (Signed)
 Sent MyChart message. No changes until appt.

## 2024-09-05 NOTE — Telephone Encounter (Signed)
 Reviewed recommendations from Dr. Conny about not making any changes, at least until FU. He said understandable.

## 2024-09-05 NOTE — Telephone Encounter (Signed)
 Pt lvm states he called about some dosage confusion.. I assume he hadnt seen mychsrt msg.

## 2024-10-06 ENCOUNTER — Ambulatory Visit: Admitting: Psychiatry

## 2024-11-20 ENCOUNTER — Ambulatory Visit: Admitting: Psychiatry
